# Patient Record
Sex: Male | Born: 2000 | Race: Black or African American | Hispanic: No | Marital: Single | Smoking: Current every day smoker
Health system: Southern US, Community
[De-identification: ages and names within clinical notes are randomized; demographics above are authoritative.]

## PROBLEM LIST (undated history)

## (undated) DIAGNOSIS — J45909 Unspecified asthma, uncomplicated: Secondary | ICD-10-CM

## (undated) DIAGNOSIS — D573 Sickle-cell trait: Secondary | ICD-10-CM

## (undated) DIAGNOSIS — F909 Attention-deficit hyperactivity disorder, unspecified type: Secondary | ICD-10-CM

---
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
## 2023-01-26 ENCOUNTER — Emergency Department (HOSPITAL_COMMUNITY): Payer: Medicaid Other

## 2023-01-26 ENCOUNTER — Other Ambulatory Visit: Payer: Self-pay

## 2023-01-26 ENCOUNTER — Emergency Department (HOSPITAL_COMMUNITY)
Admission: EM | Admit: 2023-01-26 | Discharge: 2023-01-26 | Disposition: A | Discharge status: Home / Self Care | Payer: Medicaid Other | Attending: Emergency Medicine | Admitting: Emergency Medicine

## 2023-01-26 DIAGNOSIS — R1084 Generalized abdominal pain: Secondary | ICD-10-CM | POA: Diagnosis present

## 2023-01-26 DIAGNOSIS — K2900 Acute gastritis without bleeding: Secondary | ICD-10-CM | POA: Diagnosis not present

## 2023-01-26 LAB — COMPREHENSIVE METABOLIC PANEL
ALT: 27 U/L (ref 0–44)
AST: 47 U/L — ABNORMAL HIGH (ref 15–41)
Albumin: 5.3 g/dL — ABNORMAL HIGH (ref 3.5–5.0)
Alkaline Phosphatase: 67 U/L (ref 38–126)
Anion gap: 16 — ABNORMAL HIGH (ref 5–15)
BUN: 15 mg/dL (ref 6–20)
CO2: 19 mmol/L — ABNORMAL LOW (ref 22–32)
Calcium: 9.8 mg/dL (ref 8.9–10.3)
Chloride: 103 mmol/L (ref 98–111)
Creatinine, Ser: 0.95 mg/dL (ref 0.61–1.24)
GFR, Estimated: >60 mL/min (ref >60)
Glucose, Bld: 135 mg/dL — ABNORMAL HIGH (ref 70–99)
Potassium: 3.1 mmol/L — ABNORMAL LOW (ref 3.5–5.1)
Sodium: 138 mmol/L (ref 135–145)
Total Bilirubin: 0.9 mg/dL (ref 0.3–1.2)
Total Protein: 8.4 g/dL — ABNORMAL HIGH (ref 6.5–8.1)

## 2023-01-26 LAB — CBC WITH DIFFERENTIAL/PLATELET
Abs Immature Granulocytes: 0.19 10*3/uL — ABNORMAL HIGH (ref 0.00–0.07)
Basophils Absolute: 0 10*3/uL (ref 0.0–0.1)
Basophils Relative: 0 %
Eosinophils Absolute: 0 10*3/uL (ref 0.0–0.5)
Eosinophils Relative: 0 %
HCT: 44.1 % (ref 39.0–52.0)
Hemoglobin: 15.6 g/dL (ref 13.0–17.0)
Immature Granulocytes: 1 %
Lymphocytes Relative: 4 %
Lymphs Abs: 0.8 10*3/uL (ref 0.7–4.0)
MCH: 29.9 pg (ref 26.0–34.0)
MCHC: 35.4 g/dL (ref 30.0–36.0)
MCV: 84.6 fL (ref 80.0–100.0)
Monocytes Absolute: 0.9 10*3/uL (ref 0.1–1.0)
Monocytes Relative: 5 %
Neutro Abs: 18.6 10*3/uL — ABNORMAL HIGH (ref 1.7–7.7)
Neutrophils Relative %: 90 %
Platelets: 220 10*3/uL (ref 150–400)
RBC: 5.21 MIL/uL (ref 4.22–5.81)
RDW: 12.1 % (ref 11.5–15.5)
WBC: 20.5 10*3/uL — ABNORMAL HIGH (ref 4.0–10.5)
nRBC: 0 % (ref 0.0–0.2)

## 2023-01-26 LAB — LIPASE, BLOOD: Lipase: 33 U/L (ref 11–51)

## 2023-01-26 MED ORDER — AZITHROMYCIN 1 G PO PACK
1.0000 g | PACK | Freq: Once | ORAL | Status: AC
  Administered 2023-01-26: 1 g via ORAL
  Filled 2023-01-26: qty 1

## 2023-01-26 MED ORDER — SODIUM CHLORIDE 0.9 % IV BOLUS
1000.0000 mL | Freq: Once | INTRAVENOUS | Status: AC
  Administered 2023-01-26: 1000 mL via INTRAVENOUS

## 2023-01-26 MED ORDER — PANTOPRAZOLE SODIUM 20 MG PO TBEC: 20.0000 mg | DELAYED_RELEASE_TABLET | Freq: Every day | ORAL | 0 refills | Status: DC

## 2023-01-26 MED ORDER — ONDANSETRON 4 MG PO TBDP
4.0000 mg | ORAL_TABLET | Freq: Once | ORAL | Status: AC
  Administered 2023-01-26: 4 mg via ORAL
  Filled 2023-01-26: qty 1

## 2023-01-26 MED ORDER — ONDANSETRON HCL 4 MG/2ML IJ SOLN
4.0000 mg | Freq: Once | INTRAMUSCULAR | Status: AC
  Administered 2023-01-26: 4 mg via INTRAVENOUS
  Filled 2023-01-26: qty 2

## 2023-01-26 MED ORDER — IOHEXOL 300 MG/ML  SOLN
100.0000 mL | Freq: Once | INTRAMUSCULAR | Status: AC | PRN
  Administered 2023-01-26: 100 mL via INTRAVENOUS

## 2023-01-26 MED ORDER — PANTOPRAZOLE SODIUM 40 MG IV SOLR
40.0000 mg | Freq: Once | INTRAVENOUS | Status: AC
  Administered 2023-01-26: 40 mg via INTRAVENOUS
  Filled 2023-01-26: qty 10

## 2023-01-26 MED ORDER — KETOROLAC TROMETHAMINE 30 MG/ML IJ SOLN
30.0000 mg | Freq: Once | INTRAMUSCULAR | Status: AC
  Administered 2023-01-26: 30 mg via INTRAVENOUS
  Filled 2023-01-26: qty 1

## 2023-01-26 NOTE — ED Provider Notes (Signed)
[facility] Provider Note   CSN: [id number] Arrival date & time: 01/26/23  1500     History  Chief Complaint  Patient presents with   Abdominal Pain    Randall Hull is a 22 y.o. male.  Patient has been taking doxycycline for chlamydia.  He complains of severe abdominal cramps.  Patient has no medical  The history is provided by the patient and medical records.  Abdominal Pain Pain location:  Generalized Pain quality: aching   Pain radiates to:  Does not radiate Onset quality:  Sudden Timing:  Constant Progression:  Worsening Chronicity:  New Context: not alcohol use   Relieved by:  Nothing Worsened by:  Nothing Ineffective treatments:  None tried Associated symptoms: no chest pain, no cough, no diarrhea, no fatigue and no hematuria        Home Medications Prior to Admission medications   Medication Sig Start Date End Date Taking? Authorizing Provider  albuterol (PROVENTIL HFA;VENTOLIN HFA) 108 (90 BASE) MCG/ACT inhaler Inhale 2 puffs into the lungs every 6 (six) hours as needed for wheezing.   Yes [provider]  cetirizine (ZYRTEC) 10 MG chewable tablet Chew 10 mg by mouth daily.   Yes [provider]  doxycycline (VIBRAMYCIN) 100 MG capsule Take 1 capsule (100 mg total) by mouth 2 (two) times daily. 05/19/18  Yes Pollina, Gwenyth Allegra, MD  pantoprazole (PROTONIX) 20 MG tablet Take 1 tablet (20 mg total) by mouth daily. 01/26/23  Yes Milton Ferguson, MD  mupirocin ointment (BACTROBAN) 2 % Apply topically 3 (three) times daily. Patient not taking: Reported on 02/10/2020 02/10/13   Kristen Cardinal, NP      Allergies    Patient has no known allergies.    Review of Systems   Review of Systems  Constitutional:  Negative for appetite change and fatigue.  HENT:  Negative for congestion, ear discharge and sinus pressure.   Eyes:  Negative for discharge.  Respiratory:  Negative for cough.   Cardiovascular:   Negative for chest pain.  Gastrointestinal:  Positive for abdominal pain. Negative for diarrhea.  Genitourinary:  Negative for frequency and hematuria.  Musculoskeletal:  Negative for back pain.  Skin:  Negative for rash.  Neurological:  Negative for seizures and headaches.  Psychiatric/Behavioral:  Negative for hallucinations.     Physical Exam Updated Vital Signs BP 132/86 (BP Location: Left Arm)   Pulse 60   Temp 98 F (36.7 C) (Oral)   Resp 18   Ht 6\' 1"  (1.854 m)   Wt 74.8 kg   SpO2 99%   BMI 21.77 kg/m  Physical Exam Vitals and nursing note reviewed.  Constitutional:      Appearance: He is well-developed.  HENT:     Head: Normocephalic.     Nose: Nose normal.  Eyes:     General: No scleral icterus.    Conjunctiva/sclera: Conjunctivae normal.  Neck:     Thyroid: No thyromegaly.  Cardiovascular:     Rate and Rhythm: Normal rate and regular rhythm.     Heart sounds: No murmur heard.    No friction rub. No gallop.  Pulmonary:     Breath sounds: No stridor. No wheezing or rales.  Chest:     Chest wall: No tenderness.  Abdominal:     General: There is no distension.     Tenderness: There is abdominal tenderness. There is no rebound.  Musculoskeletal:        General: Normal  range of motion.     Cervical back: Neck supple.  Lymphadenopathy:     Cervical: No cervical adenopathy.  Skin:    Findings: No erythema or rash.  Neurological:     Mental Status: He is alert and oriented to person, place, and time.     Motor: No abnormal muscle tone.     Coordination: Coordination normal.  Psychiatric:        Behavior: Behavior normal.     ED Results / Procedures / Treatments   Labs (all labs ordered are listed, but only abnormal results are displayed) Labs Reviewed  CBC WITH DIFFERENTIAL/PLATELET - Abnormal; Notable for the following components:      Result Value   WBC 20.5 (*)    Neutro Abs 18.6 (*)    Abs Immature Granulocytes 0.19 (*)    All other components  within normal limits  COMPREHENSIVE METABOLIC PANEL - Abnormal; Notable for the following components:   Potassium 3.1 (*)    CO2 19 (*)    Glucose, Bld 135 (*)    Total Protein 8.4 (*)    Albumin 5.3 (*)    AST 47 (*)    Anion gap 16 (*)    All other components within normal limits  LIPASE, BLOOD    EKG None  Radiology CT ABDOMEN PELVIS W CONTRAST  Result Date: 01/26/2023 CLINICAL DATA:  Acute abdominal pain. Nausea and vomiting. EXAM: CT ABDOMEN AND PELVIS WITH CONTRAST TECHNIQUE: Multidetector CT imaging of the abdomen and pelvis was performed using the standard protocol following bolus administration of intravenous contrast. RADIATION DOSE REDUCTION: This exam was performed according to the departmental dose-optimization program which includes automated exposure control, adjustment of the mA and/or kV according to patient size and/or use of iterative reconstruction technique. CONTRAST:  11mL OMNIPAQUE IOHEXOL 300 MG/ML  SOLN COMPARISON:  CT 02/10/2020 FINDINGS: Lower chest: Clear lung bases. Hepatobiliary: No focal liver abnormality is seen. No gallstones, gallbladder wall thickening, or biliary dilatation. Pancreas: Unremarkable. No pancreatic ductal dilatation or surrounding inflammatory changes. Spleen: Normal in size without focal abnormality. Adrenals/Urinary Tract: Normal adrenal glands. No hydronephrosis, perirenal inflammation or renal stone. Small simple cyst in the upper pole of the right kidney, needing no further imaging follow-up. Decompressed ureters. Unremarkable urinary bladder. Stomach/Bowel: Detailed bowel assessment is limited in the absence of enteric contrast and paucity of intra-abdominal fat. No bowel obstruction or inflammatory change. The appendix is not confidently visualized, there is no evidence of appendicitis. Vascular/Lymphatic: No significant vascular findings are present. No enlarged abdominal or pelvic lymph nodes. Reproductive: Prostate is unremarkable. Other:  No free air, free fluid, or intra-abdominal fluid collection. Musculoskeletal: There are no acute or suspicious osseous abnormalities. IMPRESSION: No acute abnormality or explanation for abdominal pain. Electronically Signed   By: Keith Rake M.D.   On: 01/26/2023 18:02    Procedures Procedures    Medications Ordered in ED Medications  azithromycin (ZITHROMAX) powder 1 g (has no administration in time range)  sodium chloride 0.9 % bolus 1,000 mL (1,000 mLs Intravenous New Bag/Given 01/26/23 1605)  ondansetron (ZOFRAN) injection 4 mg (4 mg Intravenous Given 01/26/23 1606)  pantoprazole (PROTONIX) injection 40 mg (40 mg Intravenous Given 01/26/23 1606)  ketorolac (TORADOL) 30 MG/ML injection 30 mg (30 mg Intravenous Given 01/26/23 1606)  iohexol (OMNIPAQUE) 300 MG/ML solution 100 mL (100 mLs Intravenous Contrast Given 01/26/23 1737)    ED Course/ Medical Decision Making/ A&P  Medical Decision Making Amount and/or Complexity of Data Reviewed Labs: ordered. Radiology: ordered.  Risk Prescription drug management.   This patient presents to the ED for concern of abdominal pain and vomiting, this involves an extensive number of treatment options, and is a complaint that carries with it a high risk of complications and morbidity.  The differential diagnosis includes gastritis from doxycycline, appendicitis   Co morbidities that complicate the patient evaluation  Chlamydia   Additional history obtained:  Additional history obtained from patient External records from outside source obtained and reviewed including hospital records   Lab Tests:  I Ordered, and personally interpreted labs.  The pertinent results include: White count 20,000   Imaging Studies ordered:  I ordered imaging studies including CT abdomen I independently visualized and interpreted imaging which showed negative I agree with the radiologist interpretation   Cardiac Monitoring: /  EKG:  The patient was maintained on a cardiac monitor.  I personally viewed and interpreted the cardiac monitored which showed an underlying rhythm of: Normal sinus rhythm   Consultations Obtained: No consultant Problem List / ED Course / Critical interventions / Medication management  Chlamydia and abdominal pain I ordered medication including Zofran for nausea and Protonix for gastritis Reevaluation of the patient after these medicines showed that the patient improved I have reviewed the patients home medicines and have made adjustments as needed   Social Determinants of Health:  None   Test / Admission - Considered:  None   Patient with gastritis secondary to doxycycline.  He is going to stop the doxycycline and will be given some Protonix and will follow-up with his family doctor.  Patient was given 1 dose of Zithromax for the chlamydia.  He is also given some Zofran        Final Clinical Impression(s) / ED Diagnoses Final diagnoses:  Acute superficial gastritis without hemorrhage    Rx / DC Orders ED Discharge Orders          Ordered    pantoprazole (PROTONIX) 20 MG tablet  Daily        01/26/23 1837              Milton Ferguson, MD 01/27/23 1222

## 2023-01-26 NOTE — ED Triage Notes (Signed)
Pt brought in by [facility] for c/o n/v with abdominal pain that started this am after taking an antibiotic on an empty stomach;   Pt vomiting green bile

## 2023-01-26 NOTE — ED Notes (Signed)
Pt vomiting- Dr Roderic Palau made aware- request for nausea meds- will hold anbx PO at this time.

## 2023-01-26 NOTE — ED Notes (Signed)
Pain subsided. Nausea and vomiting still present.

## 2023-01-26 NOTE — ED Notes (Signed)
Pt reports taking doxycycline at 1000hrs today, with a sudden onset of generalized abdominal pain within 45 minutes. Pt reports nausea and diarrhea, but no vomiting. Pain is described as throbbing and sharp. There is also pain in his lower back, para spinous, associated with the abdominal pain.

## 2023-01-26 NOTE — Discharge Instructions (Signed)
Stop taking the doxycycline and follow-up with your family doctor next week for recheck

## 2023-03-06 ENCOUNTER — Emergency Department (HOSPITAL_COMMUNITY)
Admission: EM | Admit: 2023-03-06 | Discharge: 2023-03-06 | Disposition: A | Discharge status: Home / Self Care | Payer: Medicaid Other | Attending: Emergency Medicine | Admitting: Emergency Medicine

## 2023-03-06 DIAGNOSIS — R112 Nausea with vomiting, unspecified: Secondary | ICD-10-CM | POA: Insufficient documentation

## 2023-03-06 DIAGNOSIS — J45909 Unspecified asthma, uncomplicated: Secondary | ICD-10-CM | POA: Insufficient documentation

## 2023-03-06 DIAGNOSIS — R197 Diarrhea, unspecified: Secondary | ICD-10-CM | POA: Insufficient documentation

## 2023-03-06 LAB — COMPREHENSIVE METABOLIC PANEL
ALT: 45 U/L — ABNORMAL HIGH (ref 0–44)
AST: 65 U/L — ABNORMAL HIGH (ref 15–41)
Albumin: 4.7 g/dL (ref 3.5–5.0)
Alkaline Phosphatase: 58 U/L (ref 38–126)
Anion gap: 16 — ABNORMAL HIGH (ref 5–15)
BUN: 10 mg/dL (ref 6–20)
CO2: 21 mmol/L — ABNORMAL LOW (ref 22–32)
Calcium: 9.2 mg/dL (ref 8.9–10.3)
Chloride: 98 mmol/L (ref 98–111)
Creatinine, Ser: 0.93 mg/dL (ref 0.61–1.24)
GFR, Estimated: >60 mL/min (ref >60)
Glucose, Bld: 111 mg/dL — ABNORMAL HIGH (ref 70–99)
Potassium: 2.9 mmol/L — ABNORMAL LOW (ref 3.5–5.1)
Sodium: 135 mmol/L (ref 135–145)
Total Bilirubin: 1 mg/dL (ref 0.3–1.2)
Total Protein: 7.4 g/dL (ref 6.5–8.1)

## 2023-03-06 LAB — CBC
HCT: 40.2 % (ref 39.0–52.0)
Hemoglobin: 14.8 g/dL (ref 13.0–17.0)
MCH: 29.9 pg (ref 26.0–34.0)
MCHC: 36.8 g/dL — ABNORMAL HIGH (ref 30.0–36.0)
MCV: 81.2 fL (ref 80.0–100.0)
Platelets: 168 10*3/uL (ref 150–400)
RBC: 4.95 MIL/uL (ref 4.22–5.81)
RDW: 12 % (ref 11.5–15.5)
WBC: 7.2 10*3/uL (ref 4.0–10.5)
nRBC: 0 % (ref 0.0–0.2)

## 2023-03-06 LAB — LIPASE, BLOOD: Lipase: 34 U/L (ref 11–51)

## 2023-03-06 MED ORDER — POTASSIUM CHLORIDE 20 MEQ PO PACK
60.0000 meq | PACK | Freq: Once | ORAL | Status: AC
  Administered 2023-03-06: 60 meq via ORAL
  Filled 2023-03-06: qty 3

## 2023-03-06 MED ORDER — LACTATED RINGERS IV BOLUS: 1000.0000 mL | Freq: Once | INTRAVENOUS | Status: DC

## 2023-03-06 MED ORDER — ONDANSETRON HCL 4 MG PO TABS: 4.0000 mg | ORAL_TABLET | Freq: Three times a day (TID) | ORAL | 0 refills | Status: DC | PRN

## 2023-03-06 MED ORDER — LOPERAMIDE HCL 2 MG PO CAPS: 2.0000 mg | ORAL_CAPSULE | Freq: Four times a day (QID) | ORAL | 0 refills | Status: DC | PRN

## 2023-03-06 NOTE — ED Provider Notes (Signed)
[place] EMERGENCY DEPARTMENT AT [facility] Provider Note  CSN: [id number] Arrival date & time: 03/06/23 6754  Chief Complaint(s) Diarrhea  HPI KARTHIK COFFER is a 22 y.o. male without significant past medical history presenting to the emergency department with vomiting and diarrhea.  He woke up this morning around 2 AM, having diarrhea and vomiting.  He reports that his symptoms have significantly improved since then.  He reports that he no longer feels nauseous.  He had some abdominal discomfort earlier, has also resolved.  He reports that he has had frequent episodes of nausea vomiting and diarrhea for around 2 or 3 years.  He does not have any medication at home to take for the symptoms.  He has an appointment with gastroenterology tomorrow.  He reports that he has had a colonoscopy before.  Was told "may be Crohn's".  He does not have any blood in his stool.  Not on any immunotherapy.  He denies any dysuria or testicular pain.   Past Medical History Past Medical History:  Diagnosis Date   Asthma    Attention deficit hyperactivity disorder (ADHD)    Sickle cell trait (HCC)    There are no problems to display for this patient.  Home Medication(s) Prior to Admission medications   Medication Sig Start Date End Date Taking? Authorizing Provider  loperamide (IMODIUM) 2 MG capsule Take 1 capsule (2 mg total) by mouth 4 (four) times daily as needed for diarrhea or loose stools. 03/06/23  Yes Lonell Grandchild, MD  ondansetron (ZOFRAN) 4 MG tablet Take 1 tablet (4 mg total) by mouth every 8 (eight) hours as needed for nausea or vomiting. 03/06/23  Yes Lonell Grandchild, MD  albuterol (PROVENTIL HFA;VENTOLIN HFA) 108 (90 BASE) MCG/ACT inhaler Inhale 2 puffs into the lungs every 6 (six) hours as needed for wheezing.    [provider]  cetirizine (ZYRTEC) 10 MG chewable tablet Chew 10 mg by mouth daily.    [provider]  doxycycline (VIBRAMYCIN) 100 MG  capsule Take 1 capsule (100 mg total) by mouth 2 (two) times daily. 05/19/18   Gilda Crease, MD  mupirocin ointment (BACTROBAN) 2 % Apply topically 3 (three) times daily. Patient not taking: Reported on 02/10/2020 02/10/13   Lowanda Foster, NP  pantoprazole (PROTONIX) 20 MG tablet Take 1 tablet (20 mg total) by mouth daily. 01/26/23   Bethann Berkshire, MD                                                                                                                                    Past Surgical History No past surgical history on file. Family History No family history on file.  Social History Social History   Tobacco Use   Smoking status: Never   Smokeless tobacco: Never  Vaping Use   Vaping Use: Every day   Substances: Nicotine  Substance Use Topics   Alcohol use:  No   Drug use: Yes    Types: Marijuana   Allergies Patient has no known allergies.  Review of Systems Review of Systems  Physical Exam Vital Signs  I have reviewed the triage vital signs BP 105/75 (BP Location: Right Arm)   Pulse 60   Temp 98.2 F (36.8 C)   Resp 16   SpO2 99%  Physical Exam  ED Results and Treatments Labs (all labs ordered are listed, but only abnormal results are displayed) Labs Reviewed  COMPREHENSIVE METABOLIC PANEL - Abnormal; Notable for the following components:      Result Value   Potassium 2.9 (*)    CO2 21 (*)    Glucose, Bld 111 (*)    AST 65 (*)    ALT 45 (*)    Anion gap 16 (*)    All other components within normal limits  CBC - Abnormal; Notable for the following components:   MCHC 36.8 (*)    All other components within normal limits  LIPASE, BLOOD                                                                                                                          Radiology No results found.  Pertinent labs & imaging results that were available during my care of the patient were reviewed by me and considered in my medical decision making (see MDM for  details).  Medications Ordered in ED Medications  potassium chloride (KLOR-CON) packet 60 mEq (60 mEq Oral Given 03/06/23 1114)                                                                                                                                     Procedures Procedures  (including critical care time)  Medical Decision Making / ED Course   MDM:  22 year old male presenting to the emergency department with nausea and diarrhea.  He reports that currently he feels well and has no symptoms.  He has an appointment with the GI physician tomorrow.  His examination is overall reassuring.  He is very well-appearing and denies current symptoms.  His abdominal exam is benign.  Unclear cause of his symptoms, could be IBS, IBD, acute gastroenteritis, cyclic vomiting syndrome.  He reports that currently his symptoms have resolved.  He declines any therapy in the emergency department.  Will check some laboratory testing but overall low concern for acute  process.  Given his recurrent symptoms at home likely will discharge with nausea medicine and Imodium.  He has very close follow-up which is reassuring.  Clinical Course as of 03/07/23 1404  Mon Mar 06, 2023  1130 Laboratory testing notable for signs of dehydration with hypokalemia, low CO2, plan to give IV fluids and replete potassium.  Patient reports that he wanted to go home and felt well, had no more symptoms.  Advised increase fluid intake.  He did receive potassium.  He has a GI appointment tomorrow for further follow-up. Will discharge patient to home. All questions answered. Patient comfortable with plan of discharge. Return precautions discussed with patient and specified on the after visit summary. [WS]    Clinical Course User Index [WS] Lonell Grandchild, MD     Additional history obtained: -Additional history obtained from family -External records from outside source obtained and reviewed including: Chart review including  previous notes, labs, imaging, consultation notes including ER visit 01/26/23   Lab Tests: -I ordered, reviewed, and interpreted labs.   The pertinent results include:   Labs Reviewed  COMPREHENSIVE METABOLIC PANEL - Abnormal; Notable for the following components:      Result Value   Potassium 2.9 (*)    CO2 21 (*)    Glucose, Bld 111 (*)    AST 65 (*)    ALT 45 (*)    Anion gap 16 (*)    All other components within normal limits  CBC - Abnormal; Notable for the following components:   MCHC 36.8 (*)    All other components within normal limits  LIPASE, BLOOD    Notable for signs of dehydration with low CO2, borderline anion gap, hypokalemia   Medicines ordered and prescription drug management: Meds ordered this encounter  Medications   potassium chloride (KLOR-CON) packet 60 mEq   DISCONTD: lactated ringers bolus 1,000 mL   ondansetron (ZOFRAN) 4 MG tablet    Sig: Take 1 tablet (4 mg total) by mouth every 8 (eight) hours as needed for nausea or vomiting.    Dispense:  12 tablet    Refill:  0   loperamide (IMODIUM) 2 MG capsule    Sig: Take 1 capsule (2 mg total) by mouth 4 (four) times daily as needed for diarrhea or loose stools.    Dispense:  12 capsule    Refill:  0    -I have reviewed the patients home medicines and have made adjustments as needed   Cardiac Monitoring: The patient was maintained on a cardiac monitor.  I personally viewed and interpreted the cardiac monitored which showed an underlying rhythm of: NSR  Social Determinants of Health:  Diagnosis or treatment significantly limited by social determinants of health: alcohol use   Reevaluation: After the interventions noted above, I reevaluated the patient and found that their symptoms have improved  Co morbidities that complicate the patient evaluation  Past Medical History:  Diagnosis Date   Asthma    Attention deficit hyperactivity disorder (ADHD)    Sickle cell trait (HCC)        Dispostion: Disposition decision including need for hospitalization was considered, and patient discharged from emergency department.    Final Clinical Impression(s) / ED Diagnoses Final diagnoses:  Nausea vomiting and diarrhea     This chart was dictated using voice recognition software.  Despite best efforts to proofread,  errors can occur which can change the documentation meaning.    Lonell Grandchild, MD 03/07/23 [phone]

## 2023-03-06 NOTE — ED Triage Notes (Addendum)
Patient here for evaluation of recurrent vomiting, diarrhea, and abdominal that first started two or three years ago. Patient states this episode feels the same as previous episodes and that he was told he may have Crohn's disease. Patient is alert, oriented, ambulating independently with steady gait.  Patient has appointment with GI tomorrow.

## 2023-03-06 NOTE — Discharge Instructions (Addendum)
We evaluated you for your nausea vomiting and diarrhea.  Your laboratory test showed some signs of dehydration.  We gave you potassium for your low potassium.  Please eat potassium rich foods for the next few days such as bananas, potatoes.  We recommended some IV fluids for your dehydration but you preferred to go home.  Please drink lots of fluids at home.  Please follow-up with your GI doctor tomorrow for further testing.  Please return if symptoms worsen, you develop severe abdominal pain, blood in the stool, or any other concerning symptoms.

## 2023-03-08 ENCOUNTER — Encounter: Payer: Self-pay | Admitting: Gastroenterology

## 2023-03-09 ENCOUNTER — Ambulatory Visit: Payer: Medicaid Other | Admitting: Gastroenterology

## 2023-03-09 ENCOUNTER — Ambulatory Visit (INDEPENDENT_AMBULATORY_CARE_PROVIDER_SITE_OTHER): Payer: Medicaid Other | Admitting: Gastroenterology

## 2023-03-09 ENCOUNTER — Other Ambulatory Visit (HOSPITAL_COMMUNITY)
Admission: RE | Admit: 2023-03-09 | Discharge: 2023-03-09 | Disposition: A | Discharge status: Home / Self Care | Payer: Medicaid Other | Source: Ambulatory Visit | Attending: Gastroenterology | Admitting: Gastroenterology

## 2023-03-09 ENCOUNTER — Encounter: Payer: Self-pay | Admitting: Gastroenterology

## 2023-03-09 VITALS — BP 104/67 | HR 79 | Temp 98.2°F | Ht 72.0 in | Wt 160.0 lb

## 2023-03-09 DIAGNOSIS — R112 Nausea with vomiting, unspecified: Secondary | ICD-10-CM

## 2023-03-09 DIAGNOSIS — R197 Diarrhea, unspecified: Secondary | ICD-10-CM | POA: Insufficient documentation

## 2023-03-09 DIAGNOSIS — R1084 Generalized abdominal pain: Secondary | ICD-10-CM | POA: Diagnosis not present

## 2023-03-09 DIAGNOSIS — R109 Unspecified abdominal pain: Secondary | ICD-10-CM | POA: Insufficient documentation

## 2023-03-09 LAB — COMPREHENSIVE METABOLIC PANEL
ALT: 32 U/L (ref 0–44)
AST: 30 U/L (ref 15–41)
Albumin: 4.6 g/dL (ref 3.5–5.0)
Alkaline Phosphatase: 57 U/L (ref 38–126)
Anion gap: 10 (ref 5–15)
BUN: 11 mg/dL (ref 6–20)
CO2: 22 mmol/L (ref 22–32)
Calcium: 9 mg/dL (ref 8.9–10.3)
Chloride: 102 mmol/L (ref 98–111)
Creatinine, Ser: 0.76 mg/dL (ref 0.61–1.24)
GFR, Estimated: >60 mL/min (ref >60)
Glucose, Bld: 99 mg/dL (ref 70–99)
Potassium: 4.1 mmol/L (ref 3.5–5.1)
Sodium: 134 mmol/L — ABNORMAL LOW (ref 135–145)
Total Bilirubin: 0.3 mg/dL (ref 0.3–1.2)
Total Protein: 7.4 g/dL (ref 6.5–8.1)

## 2023-03-09 LAB — SEDIMENTATION RATE: Sed Rate: 2 mm/hr (ref 0–16)

## 2023-03-09 LAB — C-REACTIVE PROTEIN: CRP: 0.7 mg/dL (ref <1.0)

## 2023-03-09 MED ORDER — ONDANSETRON HCL 4 MG PO TABS: 4.0000 mg | ORAL_TABLET | Freq: Three times a day (TID) | ORAL | 1 refills | Status: DC | PRN

## 2023-03-09 MED ORDER — PANTOPRAZOLE SODIUM 40 MG PO TBEC: 40.0000 mg | DELAYED_RELEASE_TABLET | Freq: Every day | ORAL | 3 refills | Status: DC

## 2023-03-09 NOTE — Patient Instructions (Signed)
Please have blood work and stool samples done at [facility].  I have sent in pantoprazole for you to take once each morning, 30 minutes before breakfast on an empty stomach.  I have sent in more Zofran to take every 8 hours as needed for nausea.  It's a good idea to cut down or avoid marijuana (just helps to sort things out).  Further recommendations once blood and stool studies done!  We will see you in 6 weeks regardless!  It was a pleasure to see you today. I want to create trusting relationships with patients and provide genuine, compassionate, and quality care. I truly value your feedback, so please be on the lookout for a survey regarding your visit with me today. I appreciate your time in completing this!    Gelene Mink, PhD, ANP-BC [facility]

## 2023-03-09 NOTE — Progress Notes (Signed)
Gastroenterology Office Note    Referring Provider: Richardean Chimera, MD Primary Care Physician:  Richardean Chimera, MD  Primary GI: Dr. Marletta Lor    Chief Complaint   Chief Complaint  Patient presents with   Abdominal Pain    Chronic abdominal pain, with occasional nausea/vomiting     History of Present Illness   Randall Hull is a 22 y.o. male presenting today at the request of Richardean Chimera, MD due to abdominal pain. Previously seen by [facility]. 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 Chronically elevated WBC count. Recent Urea breath test negative (Dec 2023). Celiac negative. EGD and colonoscopy normal in 2021 at [facility].  Patient notes at least 3 years of intermittent vomiting, lasting for hours. Associated shaking. Feels episodes are becoming more frequent. No ETOH in 3-4 months. Uses marijuana 2-3 times per day. Stopped for a few months but did not have improvement in symptoms. Will have abdominal pain starting around 2am-6am, then subsequent vomiting. Hiccups. Sips water and lays on side to try and have relief. Denies NSAIDs. +GERD, no PPI. Take Tums. No postprandial abdominal pain. Notes intermittent nausea. Has diarrhea with N/V episodes. Urgent stools. Having diarrhea currently. No IV drug use.    CT abd/pelvis with contrast March 2024: no acute abnormality.    April 2024 CT abd/pelvis with contrast: diffusely fluid-filled, nondilated small bowel loops with subjective mural thickening.   Labs 03/06/23: AST 65, ALT 45  05/19/20 EGD/colonoscopy normal (Dr. Nicanor Bake)     Past Medical History:  Diagnosis Date   Asthma    Attention deficit hyperactivity disorder (ADHD)    Sickle cell trait (HCC)     No past surgical history on file.  Current Outpatient Medications  Medication Sig Dispense Refill   ondansetron (ZOFRAN) 4 MG tablet Take 1 tablet (4 mg total) by mouth every 8 (eight) hours as needed for nausea or vomiting. 30  tablet 1   pantoprazole (PROTONIX) 40 MG tablet Take 1 tablet (40 mg total) by mouth daily. 30 minutes before breakfast daily 30 tablet 3   No current facility-administered medications for this visit.    Allergies as of 03/09/2023   (No Known Allergies)    No family history on file.  Social History   Socioeconomic History   Marital status: Single    Spouse name: Not on file   Number of children: Not on file   Years of education: Not on file   Highest education level: Not on file  Occupational History   Not on file  Tobacco Use   Smoking status: Every Day    Types: Cigarettes, E-cigarettes   Smokeless tobacco: Never  Vaping Use   Vaping Use: Every day   Substances: Nicotine  Substance and Sexual Activity   Alcohol use: No   Drug use: Yes    Types: Marijuana   Sexual activity: Yes  Other Topics Concern   Not on file  Social History Narrative   Not on file   Social Determinants of Health   Financial Resource Strain: Not on file  Food Insecurity: Not on file  Transportation Needs: Not on file  Physical Activity: Not on file  Stress: Not on file  Social Connections: Not on file  Intimate Partner Violence: Not on file     Review of Systems   Gen: Denies any fever, chills, fatigue, weight loss, lack of appetite.  CV: Denies chest pain, heart palpitations, peripheral edema, syncope.  Resp: Denies shortness of breath at rest or with exertion. Denies wheezing or cough.  GI: Denies dysphagia or odynophagia. Denies jaundice, hematemesis, fecal incontinence. GU : Denies urinary burning, urinary frequency, urinary hesitancy MS: Denies joint pain, muscle weakness, cramps, or limitation of movement.  Derm: Denies rash, itching, dry skin Psych: Denies depression, anxiety, memory loss, and confusion Heme: Denies bruising, bleeding, and enlarged lymph nodes.   Physical Exam   BP 104/67 (BP Location: Left Arm, Patient Position: Sitting, Cuff Size: Normal)   Pulse 79    Temp 98.2 F (36.8 C) (Oral)   Ht 6' (1.829 m)   Wt 160 lb (72.6 kg)   SpO2 98%   BMI 21.70 kg/m  General:   Alert and oriented. Pleasant and cooperative. Well-nourished and well-developed.  Head:  Normocephalic and atraumatic. Eyes:  Without icterus Ears:  Normal auditory acuity. Lungs:  Clear to auscultation bilaterally.  Heart:  S1, S2 present without murmurs appreciated.  Abdomen:  +BS, soft, non-tender and non-distended. No HSM noted. No guarding or rebound. No masses appreciated.  Rectal:  Deferred  Msk:  Symmetrical without gross deformities. Normal posture. Extremities:  Without edema. Neurologic:  Alert and  oriented x4;  grossly normal neurologically. Skin:  Intact without significant lesions or rashes. Psych:  Alert and cooperative. Normal mood and affect.   Assessment   Randall Hull is a 22 y.o. male presenting today  at the request of Richardean Chimera, MD due to abdominal pain.   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 Prior evaluation at [facility]. Thus far, celiac serologies negative, EGD and colonoscopy normal in 2021. He notes that even with cessation of marijuana for months, he continued to have episodes and feels these are becoming more severe.  Intermittent diarrhea: noted with episodes of N/V. Will check stool studies today.   GERD: without PPI. May be contributing to intermittent vomiting episodes.   Elevated transaminases: AST 65, ALT 45 recently. Will update now and may need [place]. Does not appear to have typical biliary symptoms.   May be dealing with IBS, functional abdominal pain, unable to rule out uncontrolled GERD, biliary process. I have asked him to avoid marijuana so as not to cloud the clinical picture. Grandmother is concerned with underlying IBD, but I have low suspicions for this.     PLAN   CMC, sed rate, CRP, fecal cal,  Cdiff, GI pathogen panel  Start pantoprazole once daily. Zofran prn nausea  Avoid cannabis  Further recommendations after labs. Will need [place] if persistently elevated LFTs. Consider updated TCS/EGD.   6 week follow-up  Gelene Mink, PhD, [facility]

## 2023-03-13 ENCOUNTER — Other Ambulatory Visit (HOSPITAL_COMMUNITY)
Admission: RE | Admit: 2023-03-13 | Discharge: 2023-03-13 | Disposition: A | Discharge status: Home / Self Care | Payer: Medicaid Other | Source: Ambulatory Visit | Attending: Gastroenterology | Admitting: Gastroenterology

## 2023-03-13 DIAGNOSIS — R197 Diarrhea, unspecified: Secondary | ICD-10-CM | POA: Insufficient documentation

## 2023-03-13 LAB — C DIFFICILE QUICK SCREEN W PCR REFLEX
C Diff antigen: NEGATIVE
C Diff interpretation: NOT DETECTED
C Diff toxin: NEGATIVE

## 2023-03-15 LAB — MISC LABCORP TEST (SEND OUT): Labcorp test code: 183480

## 2023-03-15 LAB — CALPROTECTIN, FECAL: Calprotectin, Fecal: 73 ug/g (ref 0–120)

## 2023-03-27 ENCOUNTER — Ambulatory Visit: Payer: Medicaid Other | Admitting: Gastroenterology

## 2023-04-20 ENCOUNTER — Encounter: Payer: Self-pay | Admitting: Gastroenterology

## 2023-04-20 ENCOUNTER — Ambulatory Visit: Payer: Medicaid Other | Admitting: Gastroenterology

## 2023-05-16 ENCOUNTER — Encounter: Payer: Self-pay | Admitting: Gastroenterology

## 2023-05-16 ENCOUNTER — Ambulatory Visit (INDEPENDENT_AMBULATORY_CARE_PROVIDER_SITE_OTHER): Payer: Medicaid Other | Admitting: Gastroenterology

## 2023-05-16 ENCOUNTER — Telehealth: Payer: Self-pay | Admitting: *Deleted

## 2023-05-16 ENCOUNTER — Encounter: Payer: Self-pay | Admitting: *Deleted

## 2023-05-16 VITALS — BP 114/63 | HR 53 | Temp 97.6°F | Ht 72.0 in | Wt 161.0 lb

## 2023-05-16 DIAGNOSIS — R197 Diarrhea, unspecified: Secondary | ICD-10-CM

## 2023-05-16 DIAGNOSIS — R112 Nausea with vomiting, unspecified: Secondary | ICD-10-CM

## 2023-05-16 DIAGNOSIS — R1084 Generalized abdominal pain: Secondary | ICD-10-CM | POA: Diagnosis not present

## 2023-05-16 MED ORDER — ONDANSETRON HCL 4 MG PO TABS: 4.0000 mg | ORAL_TABLET | Freq: Three times a day (TID) | ORAL | 3 refills | Status: DC | PRN

## 2023-05-16 MED ORDER — PANTOPRAZOLE SODIUM 40 MG PO TBEC: 40.0000 mg | DELAYED_RELEASE_TABLET | Freq: Every day | ORAL | 3 refills | Status: AC

## 2023-05-16 NOTE — Patient Instructions (Signed)
Start taking pantoprazole 30 minutes before breakfast daily.   You can take Zofran as needed.  I have attached a list of foods to avoid.  We are arranging a colonoscopy and upper endoscopy with Dr. Marletta Lor in the near future.  We may need to do additional imaging after this.   I will see you in follow-up after this!  Please avoid marijuana, as this can make symptoms worse.    I enjoyed seeing you again today! I value our relationship and want to provide genuine, compassionate, and quality care. You may receive a survey regarding your visit with me, and I welcome your feedback! Thanks so much for taking the time to complete this. I look forward to seeing you again.      Gelene Mink, PhD, ANP-BC [facility]

## 2023-05-16 NOTE — Progress Notes (Signed)
Gastroenterology Office Note     Primary Care Physician:  Randall Chimera, MD  Primary Gastroenterologist: Dr. Marletta Hull   Chief Complaint   Chief Complaint  Patient presents with   Follow-up    Follow up on abd pain, diarrhea (3 to 4 times) nausea a lot, vomiting     History of Present Illness   Randall Hull is a 22 y.o. male presenting today with history of chronic abdominal pain, chronic N/V, GERD, intermittent diarrhea with N/V episodes. 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     Cdiff, GI profile negative. Normal fecal cal. Normal sed rate and CRP. I added a PPI at last visit, as he had not been on one. Query dealing with IBS, functional abdominal pain, uncontrolled GERD, biliary process. Here to discuss updated colonoscopy/EGD and symptoms.    Took pantoprazole for a few days. Not taking now. Not good with taking meds consistently. Zofran as needed for nausea. Having intermittent loose stools here and there but not as bad as before. Has more of a nausea feeling moreso than pain. Craving hot sauce lately. Eats lots of protein and fruits, salads.Ate Takis yesterday and had abdominal burning.  Drinks gatorade, pedialyte, thirsty. Occasional epigastric pain postprandially. Intermittently lower abdominal pain sharp pain. Takes Ibuprofen sparingly.   No alcohol use. Has slowed down on marijuana. Smokes daily but not as frequently during the day.    CT abd/pelvis with contrast March 2024: no acute abnormality.   April 2024 CT abd/pelvis with contrast: diffusely fluid-filled, nondilated small bowel loops with subjective mural thickening.    EGD and colonoscopy normal in 2021 at [facility].   Past Medical History:  Diagnosis Date   Asthma    Attention deficit hyperactivity disorder (ADHD)    Sickle cell trait (HCC)     No past surgical history on file.  Current Outpatient Medications  Medication Sig  Dispense Refill   ondansetron (ZOFRAN) 4 MG tablet Take 1 tablet (4 mg total) by mouth every 8 (eight) hours as needed for nausea or vomiting. (Patient not taking: Reported on 05/16/2023) 30 tablet 1   pantoprazole (PROTONIX) 40 MG tablet Take 1 tablet (40 mg total) by mouth daily. 30 minutes before breakfast daily (Patient not taking: Reported on 05/16/2023) 30 tablet 3   No current facility-administered medications for this visit.    Allergies as of 05/16/2023   (No Known Allergies)    No family history on file.  Social History   Socioeconomic History   Marital status: Single    Spouse name: Not on file   Number of children: Not on file   Years of education: Not on file   Highest education level: Not on file  Occupational History   Not on file  Tobacco Use   Smoking status: Every Day    Types: Cigarettes, E-cigarettes   Smokeless tobacco: Never  Vaping Use   Vaping Use: Every day   Substances: Nicotine  Substance and Sexual Activity   Alcohol use: No   Drug use: Yes    Types: Marijuana   Sexual activity: Yes  Other Topics Concern   Not on file  Social History Narrative   Not on file   Social Determinants of Health   Financial Resource Strain: Not on file  Food Insecurity: Not on file  Transportation Needs: Not on file  Physical Activity: Not on file  Stress: Not on file  Social Connections: Not  on file  Intimate Partner Violence: Not on file     Review of Systems   Gen: Denies any fever, chills, fatigue, weight loss, lack of appetite.  CV: Denies chest pain, heart palpitations, peripheral edema, syncope.  Resp: Denies shortness of breath at rest or with exertion. Denies wheezing or cough.  GI: Denies dysphagia or odynophagia. Denies jaundice, hematemesis, fecal incontinence. GU : Denies urinary burning, urinary frequency, urinary hesitancy MS: Denies joint pain, muscle weakness, cramps, or limitation of movement.  Derm: Denies rash, itching, dry  skin Psych: Denies depression, anxiety, memory loss, and confusion Heme: Denies bruising, bleeding, and enlarged lymph nodes.   Physical Exam   BP 114/63   Pulse (!) 53   Temp 97.6 F (36.4 C)   Ht 6' (1.829 m)   Wt 161 lb (73 kg)   BMI 21.84 kg/m  General:   Alert and oriented. Pleasant and cooperative. Well-nourished and well-developed.  Head:  Normocephalic and atraumatic. Eyes:  Without icterus Abdomen:  +BS, soft, non-tender and non-distended. No HSM noted. No guarding or rebound. No masses appreciated.  Rectal:  Deferred  Msk:  Symmetrical without gross deformities. Normal posture. Extremities:  Without edema. Neurologic:  Alert and  oriented x4;  grossly normal neurologically. Skin:  Intact without significant lesions or rashes. Psych:  Alert and cooperative. Normal mood and affect.   Assessment   Randall Hull is a 22 y.o. male presenting today with history of chronic abdominal pain, chronic N/V, GERD, intermittent diarrhea with N/V episodes. 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   Chronic N/V: I suspect this is precipitated by uncontrolled GERD. He has not started to take PPI consistently as I previously recommended. We discussed it may take up to 14 days to have significant relief. I have also advised him to stop marijuana, as I do feel this is contributing to symptoms.   Intermittent diarrhea: likely IBS. This is improved from prior visits. Prior CT in April 2024 with diffusely fluid-filled nondilated small bowel loops. I suspect this was infectious process. Family had been concerned about IBD but inflammatory markers normal. I doubt dealing with underlying small bowel Crohn's. Prior colonoscopy and EGD at [facility] in 2021.  Patient and grandmother desire updated colonoscopy/EGD. Symptoms do not appear biliary-related. Can consider updated imaging after procedures to ensure no persistent small bowel  etiology.       PLAN    Proceed with colonoscopy/EGD by Dr. Marletta Hull  in near future: the risks, benefits, and alternatives have been discussed with the patient in detail. The patient states understanding and desires to proceed.  Resume pantoprazole every day.  GERD diet provided Avoid marijuana Consider further imaging in the future Follow-up following procedures    Gelene Mink, PhD, ANP-BC [facility]

## 2023-05-16 NOTE — Telephone Encounter (Signed)
[facility] PA: Procedure Code 1 16109 - DIAGNOSTIC COLONOSCOPY  Quantity 1 Days  Status NO AUTH REQUIRED  Procedure Code 2 60454 - EGD DIAGNOSTIC BRUSH WASH  Quantity 1 Days  Status NO AUTH REQUIRED

## 2023-05-16 NOTE — H&P (View-Only) (Signed)
Gastroenterology Office Note     Primary Care Physician:  Richardean Chimera, MD  Primary Gastroenterologist: Dr. Marletta Lor   Chief Complaint   Chief Complaint  Patient presents with   Follow-up    Follow up on abd pain, diarrhea (3 to 4 times) nausea a lot, vomiting     History of Present Illness   Randall Hull is a 22 y.o. male presenting today with history of chronic abdominal pain, chronic N/V, GERD, intermittent diarrhea with N/V episodes. 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     Cdiff, GI profile negative. Normal fecal cal. Normal sed rate and CRP. I added a PPI at last visit, as he had not been on one. Query dealing with IBS, functional abdominal pain, uncontrolled GERD, biliary process. Here to discuss updated colonoscopy/EGD and symptoms.    Took pantoprazole for a few days. Not taking now. Not good with taking meds consistently. Zofran as needed for nausea. Having intermittent loose stools here and there but not as bad as before. Has more of a nausea feeling moreso than pain. Craving hot sauce lately. Eats lots of protein and fruits, salads.Ate Takis yesterday and had abdominal burning.  Drinks gatorade, pedialyte, thirsty. Occasional epigastric pain postprandially. Intermittently lower abdominal pain sharp pain. Takes Ibuprofen sparingly.   No alcohol use. Has slowed down on marijuana. Smokes daily but not as frequently during the day.    CT abd/pelvis with contrast March 2024: no acute abnormality.   April 2024 CT abd/pelvis with contrast: diffusely fluid-filled, nondilated small bowel loops with subjective mural thickening.    EGD and colonoscopy normal in 2021 at [facility].   Past Medical History:  Diagnosis Date   Asthma    Attention deficit hyperactivity disorder (ADHD)    Sickle cell trait (HCC)     No past surgical history on file.  Current Outpatient Medications  Medication Sig  Dispense Refill   ondansetron (ZOFRAN) 4 MG tablet Take 1 tablet (4 mg total) by mouth every 8 (eight) hours as needed for nausea or vomiting. (Patient not taking: Reported on 05/16/2023) 30 tablet 1   pantoprazole (PROTONIX) 40 MG tablet Take 1 tablet (40 mg total) by mouth daily. 30 minutes before breakfast daily (Patient not taking: Reported on 05/16/2023) 30 tablet 3   No current facility-administered medications for this visit.    Allergies as of 05/16/2023   (No Known Allergies)    No family history on file.  Social History   Socioeconomic History   Marital status: Single    Spouse name: Not on file   Number of children: Not on file   Years of education: Not on file   Highest education level: Not on file  Occupational History   Not on file  Tobacco Use   Smoking status: Every Day    Types: Cigarettes, E-cigarettes   Smokeless tobacco: Never  Vaping Use   Vaping Use: Every day   Substances: Nicotine  Substance and Sexual Activity   Alcohol use: No   Drug use: Yes    Types: Marijuana   Sexual activity: Yes  Other Topics Concern   Not on file  Social History Narrative   Not on file   Social Determinants of Health   Financial Resource Strain: Not on file  Food Insecurity: Not on file  Transportation Needs: Not on file  Physical Activity: Not on file  Stress: Not on file  Social Connections: Not  on file  Intimate Partner Violence: Not on file     Review of Systems   Gen: Denies any fever, chills, fatigue, weight loss, lack of appetite.  CV: Denies chest pain, heart palpitations, peripheral edema, syncope.  Resp: Denies shortness of breath at rest or with exertion. Denies wheezing or cough.  GI: Denies dysphagia or odynophagia. Denies jaundice, hematemesis, fecal incontinence. GU : Denies urinary burning, urinary frequency, urinary hesitancy MS: Denies joint pain, muscle weakness, cramps, or limitation of movement.  Derm: Denies rash, itching, dry  skin Psych: Denies depression, anxiety, memory loss, and confusion Heme: Denies bruising, bleeding, and enlarged lymph nodes.   Physical Exam   BP 114/63   Pulse (!) 53   Temp 97.6 F (36.4 C)   Ht 6' (1.829 m)   Wt 161 lb (73 kg)   BMI 21.84 kg/m  General:   Alert and oriented. Pleasant and cooperative. Well-nourished and well-developed.  Head:  Normocephalic and atraumatic. Eyes:  Without icterus Abdomen:  +BS, soft, non-tender and non-distended. No HSM noted. No guarding or rebound. No masses appreciated.  Rectal:  Deferred  Msk:  Symmetrical without gross deformities. Normal posture. Extremities:  Without edema. Neurologic:  Alert and  oriented x4;  grossly normal neurologically. Skin:  Intact without significant lesions or rashes. Psych:  Alert and cooperative. Normal mood and affect.   Assessment   Randall Hull is a 22 y.o. male presenting today with history of chronic abdominal pain, chronic N/V, GERD, intermittent diarrhea with N/V episodes. 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   Chronic N/V: I suspect this is precipitated by uncontrolled GERD. He has not started to take PPI consistently as I previously recommended. We discussed it may take up to 14 days to have significant relief. I have also advised him to stop marijuana, as I do feel this is contributing to symptoms.   Intermittent diarrhea: likely IBS. This is improved from prior visits. Prior CT in April 2024 with diffusely fluid-filled nondilated small bowel loops. I suspect this was infectious process. Family had been concerned about IBD but inflammatory markers normal. I doubt dealing with underlying small bowel Crohn's. Prior colonoscopy and EGD at [facility] in 2021.  Patient and grandmother desire updated colonoscopy/EGD. Symptoms do not appear biliary-related. Can consider updated imaging after procedures to ensure no persistent small bowel  etiology.       PLAN    Proceed with colonoscopy/EGD by Dr. Marletta Lor  in near future: the risks, benefits, and alternatives have been discussed with the patient in detail. The patient states understanding and desires to proceed.  Resume pantoprazole every day.  GERD diet provided Avoid marijuana Consider further imaging in the future Follow-up following procedures    Gelene Mink, PhD, ANP-BC [facility]

## 2023-06-06 ENCOUNTER — Encounter: Payer: Self-pay | Admitting: Gastroenterology

## 2023-06-13 ENCOUNTER — Ambulatory Visit (HOSPITAL_COMMUNITY): Payer: Medicaid Other | Admitting: Certified Registered"

## 2023-06-13 ENCOUNTER — Other Ambulatory Visit: Payer: Self-pay

## 2023-06-13 ENCOUNTER — Encounter (HOSPITAL_COMMUNITY): Payer: Self-pay

## 2023-06-13 ENCOUNTER — Encounter (HOSPITAL_COMMUNITY)
Admission: RE | Disposition: A | Discharge status: Home / Self Care | Payer: Self-pay | Source: Home / Self Care | Attending: Internal Medicine

## 2023-06-13 ENCOUNTER — Ambulatory Visit (HOSPITAL_BASED_OUTPATIENT_CLINIC_OR_DEPARTMENT_OTHER): Payer: Medicaid Other | Admitting: Certified Registered"

## 2023-06-13 ENCOUNTER — Ambulatory Visit (HOSPITAL_COMMUNITY)
Admission: RE | Admit: 2023-06-13 | Discharge: 2023-06-13 | Disposition: A | Discharge status: Home / Self Care | Payer: Medicaid Other | Source: Home / Self Care | Attending: Internal Medicine | Admitting: Internal Medicine

## 2023-06-13 DIAGNOSIS — F17210 Nicotine dependence, cigarettes, uncomplicated: Secondary | ICD-10-CM | POA: Insufficient documentation

## 2023-06-13 DIAGNOSIS — K219 Gastro-esophageal reflux disease without esophagitis: Secondary | ICD-10-CM | POA: Diagnosis not present

## 2023-06-13 DIAGNOSIS — K2970 Gastritis, unspecified, without bleeding: Secondary | ICD-10-CM

## 2023-06-13 DIAGNOSIS — R112 Nausea with vomiting, unspecified: Secondary | ICD-10-CM | POA: Diagnosis present

## 2023-06-13 DIAGNOSIS — J45909 Unspecified asthma, uncomplicated: Secondary | ICD-10-CM | POA: Diagnosis not present

## 2023-06-13 DIAGNOSIS — G8929 Other chronic pain: Secondary | ICD-10-CM | POA: Insufficient documentation

## 2023-06-13 DIAGNOSIS — K529 Noninfective gastroenteritis and colitis, unspecified: Secondary | ICD-10-CM | POA: Diagnosis not present

## 2023-06-13 DIAGNOSIS — R197 Diarrhea, unspecified: Secondary | ICD-10-CM

## 2023-06-13 HISTORY — PX: BIOPSY: SHX5522

## 2023-06-13 HISTORY — PX: ESOPHAGOGASTRODUODENOSCOPY (EGD) WITH PROPOFOL: SHX5813

## 2023-06-13 HISTORY — PX: COLONOSCOPY WITH PROPOFOL: SHX5780

## 2023-06-13 SURGERY — COLONOSCOPY WITH PROPOFOL
Anesthesia: General

## 2023-06-13 MED ORDER — DEXMEDETOMIDINE HCL IN NACL 80 MCG/20ML IV SOLN
INTRAVENOUS | Status: DC | PRN
  Administered 2023-06-13 (×2): 8 ug via INTRAVENOUS

## 2023-06-13 MED ORDER — MIDAZOLAM HCL 2 MG/2ML IJ SOLN
INTRAMUSCULAR | Status: AC
  Filled 2023-06-13: qty 2

## 2023-06-13 MED ORDER — PROPOFOL 10 MG/ML IV BOLUS
INTRAVENOUS | Status: DC | PRN
Start: 2023-06-13 — End: 2023-06-13
  Administered 2023-06-13 (×2): 50 mg via INTRAVENOUS
  Administered 2023-06-13: 100 mg via INTRAVENOUS

## 2023-06-13 MED ORDER — LIDOCAINE HCL (CARDIAC) PF 100 MG/5ML IV SOSY
PREFILLED_SYRINGE | INTRAVENOUS | Status: DC | PRN
  Administered 2023-06-13: 50 mg via INTRAVENOUS

## 2023-06-13 MED ORDER — PROPOFOL 500 MG/50ML IV EMUL
INTRAVENOUS | Status: DC | PRN
  Administered 2023-06-13: 250 ug/kg/min via INTRAVENOUS

## 2023-06-13 MED ORDER — MIDAZOLAM HCL 2 MG/2ML IJ SOLN
INTRAMUSCULAR | Status: DC | PRN
  Administered 2023-06-13: 2 mg via INTRAVENOUS

## 2023-06-13 MED ORDER — LACTATED RINGERS IV SOLN: INTRAVENOUS | Status: DC

## 2023-06-13 NOTE — Interval H&P Note (Signed)
History and Physical Interval Note:  06/13/2023 10:08 AM  Randall Hull  has presented today for surgery, with the diagnosis of chroic abdominal pain, N/V,intermittent diarrhea.  The various methods of treatment have been discussed with the patient and family. After consideration of risks, benefits and other options for treatment, the patient has consented to  Procedure(s) with comments: COLONOSCOPY WITH PROPOFOL (N/A) - 9:30 am, asa 2 ESOPHAGOGASTRODUODENOSCOPY (EGD) WITH PROPOFOL (N/A) as a surgical intervention.  The patient's history has been reviewed, patient examined, no change in status, stable for surgery.  I have reviewed the patient's chart and labs.  Questions were answered to the patient's satisfaction.     Lanelle Bal

## 2023-06-13 NOTE — Transfer of Care (Signed)
Immediate Anesthesia Transfer of Care Note  Patient: Randall Hull  Procedure(s) Performed: COLONOSCOPY WITH PROPOFOL ESOPHAGOGASTRODUODENOSCOPY (EGD) WITH PROPOFOL BIOPSY  Patient Location: Endoscopy Unit  Anesthesia Type:General  Level of Consciousness: drowsy  Airway & Oxygen Therapy: Patient Spontanous Breathing  Post-op Assessment: Report given to RN and Post -op Vital signs reviewed and stable  Post vital signs: Reviewed and stable  Last Vitals:  Vitals Value Taken Time  BP    Temp    Pulse    Resp    SpO2      Last Pain:  Vitals:   06/13/23 1038  TempSrc:   PainSc: 0-No pain      Patients Stated Pain Goal: 8 (06/13/23 0827)  Complications: No notable events documented.

## 2023-06-13 NOTE — Discharge Instructions (Addendum)
EGD Discharge instructions Please read the instructions outlined below and refer to this sheet in the next few weeks. These discharge instructions provide you with general information on caring for yourself after you leave the hospital. Your doctor may also give you specific instructions. While your treatment has been planned according to the most current medical practices available, unavoidable complications occasionally occur. If you have any problems or questions after discharge, please call your doctor. ACTIVITY You may resume your regular activity but move at a slower pace for the next 24 hours.  Take frequent rest periods for the next 24 hours.  Walking will help expel (get rid of) the air and reduce the bloated feeling in your abdomen.  No driving for 24 hours (because of the anesthesia (medicine) used during the test).  You may shower.  Do not sign any important legal documents or operate any machinery for 24 hours (because of the anesthesia used during the test).  NUTRITION Drink plenty of fluids.  You may resume your normal diet.  Begin with a light meal and progress to your normal diet.  Avoid alcoholic beverages for 24 hours or as instructed by your caregiver.  MEDICATIONS You may resume your normal medications unless your caregiver tells you otherwise.  WHAT YOU CAN EXPECT TODAY You may experience abdominal discomfort such as a feeling of fullness or "gas" pains.  FOLLOW-UP Your doctor will discuss the results of your test with you.  SEEK IMMEDIATE MEDICAL ATTENTION IF ANY OF THE FOLLOWING OCCUR: Excessive nausea (feeling sick to your stomach) and/or vomiting.  Severe abdominal pain and distention (swelling).  Trouble swallowing.  Temperature over 101 F (37.8 C).  Rectal bleeding or vomiting of blood.      Colonoscopy Discharge Instructions  Read the instructions outlined below and refer to this sheet in the next few weeks. These discharge instructions provide you  with general information on caring for yourself after you leave the hospital. Your doctor may also give you specific instructions. While your treatment has been planned according to the most current medical practices available, unavoidable complications occasionally occur.   ACTIVITY You may resume your regular activity, but move at a slower pace for the next 24 hours.  Take frequent rest periods for the next 24 hours.  Walking will help get rid of the air and reduce the bloated feeling in your belly (abdomen).  No driving for 24 hours (because of the medicine (anesthesia) used during the test).   Do not sign any important legal documents or operate any machinery for 24 hours (because of the anesthesia used during the test).  NUTRITION Drink plenty of fluids.  You may resume your normal diet as instructed by your doctor.  Begin with a light meal and progress to your normal diet. Heavy or fried foods are harder to digest and may make you feel sick to your stomach (nauseated).  Avoid alcoholic beverages for 24 hours or as instructed.  MEDICATIONS You may resume your normal medications unless your doctor tells you otherwise.  WHAT YOU CAN EXPECT TODAY Some feelings of bloating in the abdomen.  Passage of more gas than usual.  Spotting of blood in your stool or on the toilet paper.  IF YOU HAD POLYPS REMOVED DURING THE COLONOSCOPY: No aspirin products for 7 days or as instructed.  No alcohol for 7 days or as instructed.  Eat a soft diet for the next 24 hours.  FINDING OUT THE RESULTS OF YOUR TEST Not all test results  are available during your visit. If your test results are not back during the visit, make an appointment with your caregiver to find out the results. Do not assume everything is normal if you have not heard from your caregiver or the medical facility. It is important for you to follow up on all of your test results.  SEEK IMMEDIATE MEDICAL ATTENTION IF: You have more than a  spotting of blood in your stool.  Your belly is swollen (abdominal distention).  You are nauseated or vomiting.  You have a temperature over 101.  You have abdominal pain or discomfort that is severe or gets worse throughout the day.   Your EGD revealed mild amount inflammation in your stomach.  I took biopsies of this to rule out infection with a bacteria called H. pylori.  I also took samples of your esophagus. Small bowel appeared normal.   Overall, your colon appeared very healthy.  I did not see any active inflammation indicative of underlying inflammatory bowel disease such as Crohn's disease or ulcerative colitis throughout your colon or end portion of your small bowel.  I took biopsies of your ileum (end portion of small bowel) given your history of enteritis prior.    Await pathology results, my office will contact you.  Follow up in GI clinic in 2-3 months. Office will contact you with appointment.   I hope you have a great rest of your week!  Hennie Duos. Marletta Lor, D.O. Gastroenterology and Hepatology [facility]

## 2023-06-13 NOTE — Anesthesia Preprocedure Evaluation (Signed)
Anesthesia Evaluation  Patient identified by MRN, date of birth, ID band Patient awake    Reviewed: Allergy & Precautions, H&P , NPO status , Patient's Chart, lab work & pertinent test results, reviewed documented beta blocker date and time   Airway Mallampati: II  TM Distance: >3 FB Neck ROM: full    Dental no notable dental hx.    Pulmonary asthma , Current Smoker and Patient abstained from smoking.   Pulmonary exam normal breath sounds clear to auscultation       Cardiovascular Exercise Tolerance: Good negative cardio ROS  Rhythm:regular Rate:Normal     Neuro/Psych  PSYCHIATRIC DISORDERS      negative neurological ROS     GI/Hepatic negative GI ROS, Neg liver ROS,,,  Endo/Other  negative endocrine ROS    Renal/GU negative Renal ROS  negative genitourinary   Musculoskeletal   Abdominal   Peds  Hematology negative hematology ROS (+)   Anesthesia Other Findings   Reproductive/Obstetrics negative OB ROS                             Anesthesia Physical Anesthesia Plan  ASA: 2  Anesthesia Plan: General   Post-op Pain Management:    Induction:   PONV Risk Score and Plan: Propofol infusion  Airway Management Planned:   Additional Equipment:   Intra-op Plan:   Post-operative Plan:   Informed Consent: I have reviewed the patients History and Physical, chart, labs and discussed the procedure including the risks, benefits and alternatives for the proposed anesthesia with the patient or authorized representative who has indicated his/her understanding and acceptance.     Dental Advisory Given  Plan Discussed with: CRNA  Anesthesia Plan Comments:        Anesthesia Quick Evaluation

## 2023-06-13 NOTE — Op Note (Signed)
[facility] Patient Name: Randall Hull Procedure Date: 06/13/2023 10:32 AM MRN: [id number] Date of Birth: [birth year] Attending MD: Hennie Duos. Marletta Lor , [place], [id number] CSN: [id number] Age: 22 Admit Type: Outpatient Procedure:                Upper GI endoscopy Indications:              Heartburn, Nausea with vomiting Providers:                Hennie Duos. Marletta Lor, DO, Angelica Ran, Zena Amos Referring MD:              Medicines:                See the Anesthesia note for documentation of the                            administered medications Complications:            No immediate complications. Estimated Blood Loss:     Estimated blood loss was minimal. Procedure:                Pre-Anesthesia Assessment:                           - The anesthesia plan was to use monitored                            anesthesia care (MAC).                           After obtaining informed consent, the endoscope was                            passed under direct vision. Throughout the                            procedure, the patient's blood pressure, pulse, and                            oxygen saturations were monitored continuously. The                            [id number]-H190 ([id number]) scope was introduced through the                            mouth, and advanced to the second part of duodenum.                            The upper GI endoscopy was accomplished without                            difficulty. The patient tolerated the procedure                            well. Scope In: 10:43:28 AM Scope Out: 10:48:36 AM Total Procedure Duration: 0 hours 5 minutes 8 seconds  Findings:      The Z-line was regular and was found  40 cm from the incisors.      Biopsies were taken with a cold forceps in the middle third of the       esophagus for histology.      Localized mild inflammation characterized by erosions and erythema was       found in the gastric body. Biopsies were taken with a cold  forceps for       Helicobacter pylori testing.      The duodenal bulb, first portion of the duodenum and second portion of       the duodenum were normal. Impression:               - Z-line regular, 40 cm from the incisors.                           - Gastritis. Biopsied.                           - Normal duodenal bulb, first portion of the                            duodenum and second portion of the duodenum.                           - Biopsies were taken with a cold forceps for                            histology in the middle third of the esophagus. Moderate Sedation:      Per Anesthesia Care Recommendation:           - Patient has a contact number available for                            emergencies. The signs and symptoms of potential                            delayed complications were discussed with the                            patient. Return to normal activities tomorrow.                            Written discharge instructions were provided to the                            patient.                           - Resume previous diet.                           - Continue present medications.                           - Await pathology results.                           -  Use a proton pump inhibitor PO daily.                           - Return to GI clinic in 3 months. Procedure Code(s):        --- Professional ---                           [phone], Esophagogastroduodenoscopy, flexible,                            transoral; with biopsy, single or multiple Diagnosis Code(s):        --- Professional ---                           K29.70, Gastritis, unspecified, without bleeding                           R12, Heartburn                           R11.2, Nausea with vomiting, unspecified CPT copyright 2022 American Medical Association. All rights reserved. The codes documented in this report are preliminary and upon coder review may  be revised to meet current compliance  requirements. Hennie Duos. Marletta Lor, DO Hennie Duos. Marletta Lor, DO 06/13/2023 10:50:34 AM This report has been signed electronically. Number of Addenda: 0

## 2023-06-13 NOTE — Op Note (Signed)
[facility] Patient Name: Randall Hull Procedure Date: 06/13/2023 10:33 AM MRN: [id number] Date of Birth: [birth year] Attending MD: Hennie Duos. Marletta Lor , [place], [id number] CSN: [id number] Age: 22 Admit Type: Outpatient Procedure:                Colonoscopy Indications:              Lower abdominal pain, Chronic diarrhea Providers:                Hennie Duos. Marletta Lor, DO, Angelica Ran, Zena Amos Referring MD:              Medicines:                See the Anesthesia note for documentation of the                            administered medications Complications:            No immediate complications. Estimated Blood Loss:     Estimated blood loss was minimal. Procedure:                Pre-Anesthesia Assessment:                           - The anesthesia plan was to use monitored                            anesthesia care (MAC).                           After obtaining informed consent, the colonoscope                            was passed under direct vision. Throughout the                            procedure, the patient's blood pressure, pulse, and                            oxygen saturations were monitored continuously. The                            [id number]-HQ190L ([id number]) scope was introduced through                            the anus and advanced to the the terminal ileum,                            with identification of the appendiceal orifice and                            IC valve. The colonoscopy was performed without                            difficulty. The patient tolerated the procedure  well. The quality of the bowel preparation was                            evaluated using the BBPS [facility] Bowel Preparation                            Scale) with scores of: Right Colon = 3, Transverse                            Colon = 3 and Left Colon = 3 (entire mucosa seen                            well with no residual staining, small fragments of                             stool or opaque liquid). The total BBPS score                            equals 9. Scope In: 10:52:22 AM Scope Out: 11:01:48 AM Scope Withdrawal Time: 0 hours 7 minutes 49 seconds  Total Procedure Duration: 0 hours 9 minutes 26 seconds  Findings:      The colon (entire examined portion) appeared normal.      The terminal ileum appeared normal. Biopsies were taken with a cold       forceps for histology.      There is no endoscopic evidence of erythema or inflammation in the       entire colon. Impression:               - The entire examined colon is normal.                           - The examined portion of the ileum was normal.                            Biopsied. Moderate Sedation:      Per Anesthesia Care Recommendation:           - Patient has a contact number available for                            emergencies. The signs and symptoms of potential                            delayed complications were discussed with the                            patient. Return to normal activities tomorrow.                            Written discharge instructions were provided to the                            patient.                           -  Resume previous diet.                           - Continue present medications.                           - Await pathology results.                           - Repeat colonoscopy at age 70 or sooner if higher                            risk for screening purposes.                           - Return to GI clinic in 3 months. Procedure Code(s):        --- Professional ---                           [phone], Colonoscopy, flexible; with biopsy, single                            or multiple Diagnosis Code(s):        --- Professional ---                           R10.30, Lower abdominal pain, unspecified                           K52.9, Noninfective gastroenteritis and colitis,                            unspecified CPT  copyright 2022 American Medical Association. All rights reserved. The codes documented in this report are preliminary and upon coder review may  be revised to meet current compliance requirements. Hennie Duos. Marletta Lor, DO Hennie Duos. Marletta Lor, DO 06/13/2023 11:05:16 AM This report has been signed electronically. Number of Addenda: 0

## 2023-06-13 NOTE — Anesthesia Procedure Notes (Signed)
Date/Time: 06/13/2023 10:47 AM  Performed by: Julian Reil, CRNAPre-anesthesia Checklist: Patient identified, Emergency Drugs available, Suction available and Patient being monitored Patient Re-evaluated:Patient Re-evaluated prior to induction Oxygen Delivery Method: Nasal cannula Induction Type: IV induction Placement Confirmation: positive ETCO2

## 2023-06-14 LAB — SURGICAL PATHOLOGY

## 2023-06-15 NOTE — Anesthesia Postprocedure Evaluation (Signed)
Anesthesia Post Note  Patient: Randall Hull  Procedure(s) Performed: COLONOSCOPY WITH PROPOFOL ESOPHAGOGASTRODUODENOSCOPY (EGD) WITH PROPOFOL BIOPSY  Patient location during evaluation: Phase II Anesthesia Type: General Level of consciousness: awake Pain management: pain level controlled Vital Signs Assessment: post-procedure vital signs reviewed and stable Respiratory status: spontaneous breathing and respiratory function stable Cardiovascular status: blood pressure returned to baseline and stable Postop Assessment: no headache and no apparent nausea or vomiting Anesthetic complications: no Comments: Late entry   No notable events documented.   Last Vitals:  Vitals:   06/13/23 1108 06/13/23 1119  BP: (!) 89/49 102/65  Pulse: (!) 54 67  Resp: 18 17  Temp:    SpO2: 98% 98%    Last Pain:  Vitals:   06/13/23 1119  TempSrc:   PainSc: 0-No pain                 Windell Norfolk

## 2023-06-16 ENCOUNTER — Encounter (HOSPITAL_COMMUNITY): Payer: Self-pay | Admitting: Internal Medicine

## 2023-07-12 ENCOUNTER — Encounter: Payer: Self-pay | Admitting: Internal Medicine

## 2024-05-02 ENCOUNTER — Ambulatory Visit: Admitting: Gastroenterology

## 2024-05-02 ENCOUNTER — Encounter: Payer: Self-pay | Admitting: Gastroenterology

## 2024-05-02 VITALS — BP 104/60 | HR 64 | Temp 98.2°F | Ht 72.0 in | Wt 169.2 lb

## 2024-05-02 DIAGNOSIS — R109 Unspecified abdominal pain: Secondary | ICD-10-CM

## 2024-05-02 DIAGNOSIS — R112 Nausea with vomiting, unspecified: Secondary | ICD-10-CM

## 2024-05-02 DIAGNOSIS — F1290 Cannabis use, unspecified, uncomplicated: Secondary | ICD-10-CM | POA: Diagnosis not present

## 2024-05-02 MED ORDER — PANTOPRAZOLE SODIUM 40 MG PO TBEC: 40.0000 mg | DELAYED_RELEASE_TABLET | Freq: Two times a day (BID) | ORAL | 3 refills | Status: AC

## 2024-05-02 NOTE — Progress Notes (Signed)
 Gastroenterology Office Note     Primary Care Physician:  Leesa Pulling, MD  Primary Gastroenterologist: Dr. Mordechai April   Chief Complaint   Chief Complaint  Patient presents with   Abdominal Pain    Has been having episodes of abdominal pain, nausea/vomiting. Was in the ER at [facility] this past Tuesday.      History of Present Illness   Randall Hull is a 23 y.o. male presenting today with a history of chronic abdominal pain, chronic N/V, GERD, intermittent diarrhea with N/V episodes. 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 Last seen in June 2024 with recommendations for colonoscopy/EGD, which were completed in the interim. Grandmother with him today.    Normal fecal cal. Normal sed rate and CRP in the past. Multiple ED presentations. 3/9 [facility] 3/9 [place] 6/2: [place] 6/3: [place] 6/10: [place]  Nausea, vomiting, abdominal pain. No pain after eating unless eating spicy foods. Starts vomiting first repetitively and then has abdominal pain. Vomiting every other week. Usually happens in the mornings, waking up with nausea. Eats whatever feels like eating. Will eat fried foods. Ate [place], gravy yesterday. Veggies. +smoking marijuana. Daily. , 2-3 blunts a day. No typical GERD. Has been taking pantoprazole  since 6/10. Prior to this was not taking it.  States vomiting with spicy foods and alcohol. Drinks beer, store liquor. 2 boot leggers. Every other week. Will sometimes drink 3. Patient states he doesn't think marijuana is causing his symptoms. He does note that vomiting will occur after drinking.   Last seen at [facility] April 23, 2024 with CT abd/pelvis with IV contrast: no acute findings. Normal pancreas. No gallstones.    HIDA in 2020: normal EF of 50% US  2020: wall thickening of gallbladder.  US  2021: normal   Colonoscopy July 2024: normal colon, normal examined TI s/p biopsy. Benign TI.    EGD July 2024: gastritis s/p biopsy, negative H.pylori, no metaplasia.    Past Medical History:  Diagnosis Date   Asthma    Attention deficit hyperactivity disorder (ADHD)    Sickle cell trait [facility])     Past Surgical History:  Procedure Laterality Date   BIOPSY  06/13/2023   Procedure: BIOPSY;  Surgeon: Vinetta Greening, DO;  Location: [facility];  Service: Endoscopy;;   COLONOSCOPY WITH PROPOFOL  N/A 06/13/2023   Procedure: COLONOSCOPY WITH PROPOFOL ;  Surgeon: Vinetta Greening, DO;  Location: [facility];  Service: Endoscopy;  Laterality: N/A;  9:30 am, asa 2   ESOPHAGOGASTRODUODENOSCOPY (EGD) WITH PROPOFOL  N/A 06/13/2023   Procedure: ESOPHAGOGASTRODUODENOSCOPY (EGD) WITH PROPOFOL ;  Surgeon: Vinetta Greening, DO;  Location: [facility];  Service: Endoscopy;  Laterality: N/A;    Current Outpatient Medications  Medication Sig Dispense Refill   pantoprazole  (PROTONIX ) 40 MG tablet Take 1 tablet (40 mg total) by mouth daily. 30 minutes before breakfast (Patient not taking: Reported on 05/02/2024) 90 tablet 3   No current facility-administered medications for this visit.    Allergies as of 05/02/2024 - Review Complete 05/02/2024  Allergen Reaction Noted   Gramineae pollens Shortness Of Breath 05/27/2019    No family history on file.  Social History   Socioeconomic History   Marital status: Single    Spouse name: Not on file   Number of children: Not on file   Years of education: Not on file   Highest education level: Not on file  Occupational History   Not on  file  Tobacco Use   Smoking status: Every Day    Types: Cigarettes, E-cigarettes   Smokeless tobacco: Never   Tobacco comments:    vapes  Vaping Use   Vaping status: Every Day   Substances: Nicotine  Substance and Sexual Activity   Alcohol use: No   Drug use: Yes    Types: Marijuana    Comment: multiple times daily   Sexual activity: Yes  Other Topics Concern   Not on file  Social History  Narrative   Not on file   Social Drivers of Health   Financial Resource Strain: Not on file  Food Insecurity: Not on file  Transportation Needs: Not on file  Physical Activity: Not on file  Stress: Not on file  Social Connections: Not on file  Intimate Partner Violence: Not on file     Review of Systems   Gen: Denies any fever, chills, fatigue, weight loss, lack of appetite.  CV: Denies chest pain, heart palpitations, peripheral edema, syncope.  Resp: Denies shortness of breath at rest or with exertion. Denies wheezing or cough.  GI: Denies dysphagia or odynophagia. Denies jaundice, hematemesis, fecal incontinence. GU : Denies urinary burning, urinary frequency, urinary hesitancy MS: Denies joint pain, muscle weakness, cramps, or limitation of movement.  Derm: Denies rash, itching, dry skin Psych: Denies depression, anxiety, memory loss, and confusion Heme: Denies bruising, bleeding, and enlarged lymph nodes.   Physical Exam   BP 104/60 (BP Location: Right Arm, Patient Position: Sitting, Cuff Size: Normal)   Pulse 64   Temp 98.2 F (36.8 C) (Oral)   Ht 6' (1.829 m)   Wt 169 lb 3.2 oz (76.7 kg)   SpO2 98%   BMI 22.95 kg/m  General:   Alert and oriented. Pleasant and cooperative. Well-nourished and well-developed.  Head:  Normocephalic and atraumatic. Eyes:  Without icterus Abdomen:  +BS, soft, non-tender and non-distended. No HSM noted. No guarding or rebound. No masses appreciated.  Rectal:  Deferred  Msk:  Symmetrical without gross deformities. Normal posture. Extremities:  Without edema. Neurologic:  Alert and  oriented x4;  grossly normal neurologically. Skin:  Intact without significant lesions or rashes. Psych:  Alert and cooperative. Flat affect. Difficulty maintaining eye contact. Stays on phone during visit.    Assessment   Randall Hull is a 23 y.o. male presenting today with a history of chronic abdominal pain, chronic N/V, GERD, intermittent  diarrhea with N/V episodes, multiple ED visits, last seen a year ago and now returning after multiple ED visits since last visit.  Symptoms of abdominal pain, N/V most consistent with cannabis hyperemesis syndrome as previously discussed, coupled with diet choices and alcohol, which correlates with his vomiting episodes. Colonoscopy, EGD on file unrevealing, recent CT negative, normal fecal cal, sed rate, and CRP. Compliance has remained an issue, and he seems to be in denial that cannabinoid use, alcohol, and refusing PPI is culprit for his recurrent attacks. Does not appear biliary. Grandmother present with him today. I had a frank discussion with him that we will be unable to help him improve if he does not consistently take the PPI, avoid alcohol, and avoid marijuana. I discussed that he will continue to have these episodes, and we have limited options for interventions in that scenario.   Will increase PPI to BID for short-term. Diet and behavior modifications discussed at length. Patient highly encouraged to use MyChart if any decline in symptoms so we can strive to manage him as outpatient and  avoid ED visits.     PLAN    Pantoprazole  40 mg BID Avoid marijuana, alcohol, spicy/greasy foods Reach out if any concerns or worsening Return in 3 months   Delman Ferns, PhD, [facility] Gastroenterology

## 2024-05-02 NOTE — Patient Instructions (Addendum)
 I have increased pantoprazole  to twice a day, 30 minutes before breakfast for the next month. Then, you can decrease it to once a day.  It's absolutely important not to smoke marijuana, as this is not helping your symptoms and can cause the intermittent vomiting. Avoid alcohol and spicy foods, or you will continue having this problem and not get better.   I'm here for you to reach out anytime on MyChart. Sign up for this if you aren't already. You can send a message and let me know if things are not going well. Let's keep you out of the hospital.  I believe in you and know you can feel better, but no amount of medication will help if there is not effort on your part as well. I know you can do it!  I will see you in 3 months!  Delman Ferns, PhD, ANP-BC [facility] Gastroenterology

## 2024-05-28 ENCOUNTER — Ambulatory Visit: Admitting: Gastroenterology

## 2024-07-08 ENCOUNTER — Encounter: Payer: Self-pay | Admitting: Gastroenterology
